# Patient Record
Sex: Female | Born: 1965 | Race: White | Hispanic: Yes | Marital: Single | Smoking: Never smoker
Health system: Southern US, Community
[De-identification: ages and names within clinical notes are randomized; demographics above are authoritative.]

## PROBLEM LIST (undated history)

## (undated) DIAGNOSIS — O24419 Gestational diabetes mellitus in pregnancy, unspecified control: Secondary | ICD-10-CM

## (undated) DIAGNOSIS — E7800 Pure hypercholesterolemia, unspecified: Secondary | ICD-10-CM

## (undated) HISTORY — DX: Gestational diabetes mellitus in pregnancy, unspecified control: O24.419

---

## 2002-04-21 ENCOUNTER — Encounter: Payer: Self-pay | Admitting: Obstetrics and Gynecology

## 2002-04-21 ENCOUNTER — Encounter (INDEPENDENT_AMBULATORY_CARE_PROVIDER_SITE_OTHER): Payer: Self-pay | Admitting: Specialist

## 2002-04-21 ENCOUNTER — Ambulatory Visit (HOSPITAL_COMMUNITY): Admission: AD | Admit: 2002-04-21 | Discharge: 2002-04-21 | Payer: Self-pay | Admitting: Obstetrics and Gynecology

## 2007-07-16 ENCOUNTER — Encounter: Payer: Self-pay | Admitting: Obstetrics & Gynecology

## 2007-07-16 ENCOUNTER — Inpatient Hospital Stay (HOSPITAL_COMMUNITY): Admission: AD | Admit: 2007-07-16 | Discharge: 2007-07-16 | Payer: Self-pay | Admitting: Obstetrics & Gynecology

## 2008-03-25 ENCOUNTER — Inpatient Hospital Stay (HOSPITAL_COMMUNITY): Admission: AD | Admit: 2008-03-25 | Discharge: 2008-03-25 | Payer: Self-pay | Admitting: Obstetrics & Gynecology

## 2008-04-12 ENCOUNTER — Ambulatory Visit (HOSPITAL_COMMUNITY): Admission: RE | Admit: 2008-04-12 | Discharge: 2008-04-12 | Payer: Self-pay | Admitting: Family Medicine

## 2008-04-19 ENCOUNTER — Ambulatory Visit (HOSPITAL_COMMUNITY): Admission: RE | Admit: 2008-04-19 | Discharge: 2008-04-19 | Payer: Self-pay | Admitting: Family Medicine

## 2008-05-10 ENCOUNTER — Ambulatory Visit (HOSPITAL_COMMUNITY): Admission: RE | Admit: 2008-05-10 | Discharge: 2008-05-10 | Payer: Self-pay | Admitting: Family Medicine

## 2008-05-18 ENCOUNTER — Ambulatory Visit (HOSPITAL_COMMUNITY): Admission: RE | Admit: 2008-05-18 | Discharge: 2008-05-18 | Payer: Self-pay | Admitting: Family Medicine

## 2008-06-01 ENCOUNTER — Inpatient Hospital Stay (HOSPITAL_COMMUNITY): Admission: AD | Admit: 2008-06-01 | Discharge: 2008-06-01 | Payer: Self-pay | Admitting: Family Medicine

## 2008-07-11 ENCOUNTER — Inpatient Hospital Stay (HOSPITAL_COMMUNITY): Admission: AD | Admit: 2008-07-11 | Discharge: 2008-07-13 | Payer: Self-pay | Admitting: Obstetrics & Gynecology

## 2008-07-11 ENCOUNTER — Ambulatory Visit: Payer: Self-pay | Admitting: Obstetrics and Gynecology

## 2008-07-18 ENCOUNTER — Encounter: Admit: 2008-07-18 | Discharge: 2008-09-04 | Payer: Self-pay | Admitting: Obstetrics & Gynecology

## 2008-07-18 ENCOUNTER — Ambulatory Visit: Payer: Self-pay | Admitting: Obstetrics & Gynecology

## 2008-08-01 ENCOUNTER — Ambulatory Visit: Payer: Self-pay | Admitting: Obstetrics & Gynecology

## 2008-08-01 ENCOUNTER — Encounter: Payer: Self-pay | Admitting: Obstetrics and Gynecology

## 2008-08-01 LAB — CONVERTED CEMR LAB
Platelets: 417 10*3/uL — ABNORMAL HIGH (ref 150–400)
RBC: 3.92 M/uL (ref 3.87–5.11)
RDW: 13.2 % (ref 11.5–15.5)
WBC: 8.3 10*3/uL (ref 4.0–10.5)

## 2008-08-08 ENCOUNTER — Ambulatory Visit: Payer: Self-pay | Admitting: Obstetrics & Gynecology

## 2008-08-09 ENCOUNTER — Ambulatory Visit (HOSPITAL_COMMUNITY): Admission: RE | Admit: 2008-08-09 | Discharge: 2008-08-09 | Payer: Self-pay | Admitting: Family Medicine

## 2008-08-11 ENCOUNTER — Ambulatory Visit: Payer: Self-pay | Admitting: Family Medicine

## 2008-08-15 ENCOUNTER — Ambulatory Visit: Payer: Self-pay | Admitting: Obstetrics & Gynecology

## 2008-08-18 ENCOUNTER — Ambulatory Visit: Payer: Self-pay | Admitting: Obstetrics & Gynecology

## 2008-08-22 ENCOUNTER — Encounter: Payer: Self-pay | Admitting: Obstetrics and Gynecology

## 2008-08-22 ENCOUNTER — Ambulatory Visit: Payer: Self-pay | Admitting: Obstetrics & Gynecology

## 2008-08-23 ENCOUNTER — Encounter: Payer: Self-pay | Admitting: Obstetrics and Gynecology

## 2008-08-25 ENCOUNTER — Ambulatory Visit: Payer: Self-pay | Admitting: Obstetrics & Gynecology

## 2008-08-29 ENCOUNTER — Ambulatory Visit: Payer: Self-pay | Admitting: Family Medicine

## 2008-09-01 ENCOUNTER — Ambulatory Visit: Payer: Self-pay | Admitting: Family Medicine

## 2008-09-05 ENCOUNTER — Ambulatory Visit: Payer: Self-pay | Admitting: Obstetrics and Gynecology

## 2008-09-05 ENCOUNTER — Inpatient Hospital Stay (HOSPITAL_COMMUNITY): Admission: AD | Admit: 2008-09-05 | Discharge: 2008-09-07 | Payer: Self-pay | Admitting: Family Medicine

## 2010-07-15 ENCOUNTER — Encounter: Payer: Self-pay | Admitting: *Deleted

## 2010-10-04 LAB — POCT URINALYSIS DIP (DEVICE)
Bilirubin Urine: NEGATIVE
Bilirubin Urine: NEGATIVE
Glucose, UA: NEGATIVE mg/dL
Nitrite: NEGATIVE
Protein, ur: 30 mg/dL — AB
Specific Gravity, Urine: 1.02 (ref 1.005–1.030)
Specific Gravity, Urine: >=1.03 (ref 1.005–1.030)
Urobilinogen, UA: 0.2 mg/dL (ref 0.0–1.0)
Urobilinogen, UA: 0.2 mg/dL (ref 0.0–1.0)
pH: 6 (ref 5.0–8.0)

## 2010-10-04 LAB — GLUCOSE, CAPILLARY
Glucose-Capillary: 79 mg/dL (ref 70–99)
Glucose-Capillary: 81 mg/dL (ref 70–99)

## 2010-10-04 LAB — CBC
HCT: 36.5 % (ref 36.0–46.0)
MCHC: 33.9 g/dL (ref 30.0–36.0)
RBC: 4.01 MIL/uL (ref 3.87–5.11)
RDW: 13.7 % (ref 11.5–15.5)
WBC: 10.5 10*3/uL (ref 4.0–10.5)

## 2010-10-04 LAB — RPR: RPR Ser Ql: NONREACTIVE

## 2010-10-04 LAB — GLUCOSE, RANDOM: Glucose, Bld: 71 mg/dL (ref 70–99)

## 2010-10-08 LAB — CBC
HCT: 35.3 % — ABNORMAL LOW (ref 36.0–46.0)
Hemoglobin: 10.2 g/dL — ABNORMAL LOW (ref 12.0–15.0)
Hemoglobin: 12.1 g/dL (ref 12.0–15.0)
MCHC: 34.1 g/dL (ref 30.0–36.0)
RBC: 3.23 MIL/uL — ABNORMAL LOW (ref 3.87–5.11)
RBC: 3.83 MIL/uL — ABNORMAL LOW (ref 3.87–5.11)
WBC: 19.6 10*3/uL — ABNORMAL HIGH (ref 4.0–10.5)
WBC: 9.5 10*3/uL (ref 4.0–10.5)

## 2010-10-08 LAB — CULTURE, BLOOD (ROUTINE X 2): Culture: NO GROWTH

## 2010-10-08 LAB — DIFFERENTIAL
Basophils Relative: 0 % (ref 0–1)
Basophils Relative: 0 % (ref 0–1)
Eosinophils Relative: 0 % (ref 0–5)
Lymphocytes Relative: 4 % — ABNORMAL LOW (ref 12–46)
Lymphocytes Relative: 7 % — ABNORMAL LOW (ref 12–46)
Lymphs Abs: 0.7 10*3/uL (ref 0.7–4.0)
Monocytes Absolute: 0.6 10*3/uL (ref 0.1–1.0)
Monocytes Relative: 6 % (ref 3–12)
Monocytes Relative: 6 % (ref 3–12)
Neutro Abs: 17.6 10*3/uL — ABNORMAL HIGH (ref 1.7–7.7)
Neutro Abs: 8.2 10*3/uL — ABNORMAL HIGH (ref 1.7–7.7)
Neutrophils Relative %: 86 % — ABNORMAL HIGH (ref 43–77)

## 2010-10-08 LAB — URINALYSIS, ROUTINE W REFLEX MICROSCOPIC
Ketones, ur: >80 mg/dL — AB
Leukocytes, UA: NEGATIVE
Protein, ur: 30 mg/dL — AB

## 2010-10-08 LAB — POCT URINALYSIS DIP (DEVICE)
Hgb urine dipstick: NEGATIVE
Ketones, ur: 15 mg/dL — AB
Protein, ur: 30 mg/dL — AB
Specific Gravity, Urine: 1.02 (ref 1.005–1.030)
Urobilinogen, UA: 0.2 mg/dL (ref 0.0–1.0)

## 2010-10-08 LAB — GLUCOSE, CAPILLARY
Glucose-Capillary: 126 mg/dL — ABNORMAL HIGH (ref 70–99)
Glucose-Capillary: 132 mg/dL — ABNORMAL HIGH (ref 70–99)
Glucose-Capillary: 136 mg/dL — ABNORMAL HIGH (ref 70–99)
Glucose-Capillary: 139 mg/dL — ABNORMAL HIGH (ref 70–99)

## 2010-10-08 LAB — URINE MICROSCOPIC-ADD ON

## 2010-10-08 LAB — STREP B DNA PROBE

## 2010-10-08 LAB — GC/CHLAMYDIA PROBE AMP, GENITAL: GC Probe Amp, Genital: NEGATIVE

## 2010-10-09 LAB — POCT URINALYSIS DIP (DEVICE)
Bilirubin Urine: NEGATIVE
Bilirubin Urine: NEGATIVE
Glucose, UA: NEGATIVE mg/dL
Hgb urine dipstick: NEGATIVE
Hgb urine dipstick: NEGATIVE
Ketones, ur: NEGATIVE mg/dL
Ketones, ur: NEGATIVE mg/dL
Protein, ur: NEGATIVE mg/dL
Protein, ur: NEGATIVE mg/dL
Specific Gravity, Urine: 1.02 (ref 1.005–1.030)
Specific Gravity, Urine: 1.025 (ref 1.005–1.030)
Urobilinogen, UA: 0.2 mg/dL (ref 0.0–1.0)
Urobilinogen, UA: 0.2 mg/dL (ref 0.0–1.0)
pH: 6.5 (ref 5.0–8.0)
pH: 7 (ref 5.0–8.0)
pH: 6.5 (ref 5.0–8.0)

## 2010-11-06 NOTE — Discharge Summary (Signed)
NAMEKIMIAH, HIBNER       ACCOUNT NO.:  [id number]   MEDICAL RECORD NO.:  [id number]          PATIENT TYPE:  INP   LOCATION:  9157                          FACILITY:  [facility]   PHYSICIAN:  Lazaro Arms, M.D.   DATE OF BIRTH:  1966/05/27   DATE OF ADMISSION:  07/11/2008  DATE OF DISCHARGE:  07/13/2008                               DISCHARGE SUMMARY   DISCHARGE DIAGNOSES:  1. Febrile illness.  2. Gestational diabetes.  3. Intrauterine pregnancy at 33 weeks' gestational age.   REASON FOR ADMISSION:  Ms. Breawna Montenegro is a 45 year old gravida 5,  para 2-0-2-2, admitted at 29-5/7 week's gestational age with abdominal  pain and fevers at home.   HOSPITAL COURSE:  The patient was admitted and had some temperatures to  101 early in her admission.  Because of some concern about her pulmonary  exam, she was started on Zithromax and the dosage of a Z-Pak.  Chest x-  ray was performed, which was negative.  Urine and blood cultures were  done, which were negative to date.  During her hospitalization because  of her gestational age and previous history of gestational diabetes,  blood sugars were checked and the patient had repeated elevated blood  sugars.  Fasting blood sugars in the 110 range and postprandial blood  sugars in the 130s to 140s range.  On hospital day #3, the patient was  approximately 36 hours afebrile and is feeling significantly better.  She has had nutrition and diabetic teaching while she was here.  She  will have her care transferred to the [facility]  where she will follow up on Monday, which will be determined once the  clinic is open for today.   FINDINGS:  Chest x-ray which showed no significant pulmonary disease.  An OB ultrasound was performed on admission, which showed an estimated  fetal weight in the 72nd percentile and estimated due date of September 16, 2008, subjectively normal amniotic fluid.  Fetal presentation that was  transverse and anterior placenta above the cervical os and cervical  length of 4.5 cm.   CONDITION AT DISCHARGE:  Good.   INSTRUCTIONS TO PATIENT:  The patient was instructed to continue her Z-  Pak and was given a prescription for the final pills.  The patient was  instructed to also continue on prenatal vitamins.  All the patient's  questions were answered and the patient was instructed to follow up at  the clinic .  The date and time  will be written on her discharge  summary.      Odie Sera, DO  Electronically Signed     ______________________________  Lazaro Arms, M.D.    MC/MEDQ  D:  07/13/2008  T:  07/13/2008  Job:  [id number]

## 2010-11-09 NOTE — Op Note (Signed)
   [name], [name]                          ACCOUNT NO.:  [id number]   MEDICAL RECORD NO.:  [id number]                   PATIENT TYPE:  AMB   LOCATION:  [facility]                                 [facility]ACILITY:  [facility]   PHYSICIAN:  Phil D. Okey Dupre, M.D.                  DATE OF BIRTH:  May 07, 1966   DATE OF PROCEDURE:  04/21/2002  DATE OF DISCHARGE:                                 OPERATIVE REPORT   PREOPERATIVE DIAGNOSIS:  Missed abortion.   POSTOPERATIVE DIAGNOSIS:  Missed abortion.   PROCEDURE:  Dilatation and evacuation.   SURGEON:  Javier Glazier. Rose, M.D.   DESCRIPTION OF PROCEDURE:  Under satisfactory sedation with the patient in  the dorsal lithotomy position, the perineum and vagina were prepped and  draped in the usual sterile manner. Bimanual pelvic examination revealed the  uterus anterior position about eight weeks' gestational size, freely movable  with normal free adnexa.  A weighted speculum was placed in the posterior  fourchette of the vagina through a marital introitus.  BUS was within normal  limits.  The vaginal was clean and well rugated, and the cervix was parous.  The anterior lip of the cervix was grasped with a single-tooth tenaculum.  The uterine cavity sounded to 10 cm.  The cervical os dilated to a #10 Haney  dilator and the 10 suction curet was used to evacuate the uterine contents  without incident.  The tenaculum and speculum were removed from the vagina  and the patient was transferred to the recovery room in satisfactory  condition.                                               Phil D. Okey Dupre, M.D.    PDR/MEDQ  D:  04/21/2002  T:  04/21/2002  Job:  [id number]

## 2011-03-14 LAB — CBC
Platelets: 463 — ABNORMAL HIGH
RBC: 4.06
WBC: 12.6 — ABNORMAL HIGH

## 2011-03-14 LAB — WET PREP, GENITAL
Clue Cells Wet Prep HPF POC: NONE SEEN
Trich, Wet Prep: NONE SEEN

## 2011-03-14 LAB — HCG, QUANTITATIVE, PREGNANCY: hCG, Beta Chain, Quant, S: 1394 — ABNORMAL HIGH

## 2011-03-14 LAB — ABO/RH: ABO/RH(D): A POS

## 2011-03-25 LAB — GC/CHLAMYDIA PROBE AMP, GENITAL
Chlamydia, DNA Probe: NEGATIVE
GC Probe Amp, Genital: NEGATIVE

## 2011-03-25 LAB — WET PREP, GENITAL: Yeast Wet Prep HPF POC: NONE SEEN

## 2011-03-28 LAB — URINALYSIS, ROUTINE W REFLEX MICROSCOPIC
Bilirubin Urine: NEGATIVE
Glucose, UA: NEGATIVE mg/dL
Hgb urine dipstick: NEGATIVE
Ketones, ur: NEGATIVE mg/dL
pH: 7 (ref 5.0–8.0)

## 2021-09-03 ENCOUNTER — Other Ambulatory Visit: Payer: Self-pay

## 2021-09-03 ENCOUNTER — Encounter (HOSPITAL_COMMUNITY): Payer: Self-pay

## 2021-09-03 ENCOUNTER — Emergency Department (HOSPITAL_COMMUNITY)
Admission: EM | Admit: 2021-09-03 | Discharge: 2021-09-03 | Disposition: A | Discharge status: Home / Self Care | Payer: Self-pay | Attending: Student | Admitting: Student

## 2021-09-03 ENCOUNTER — Encounter (HOSPITAL_COMMUNITY): Payer: Self-pay | Admitting: Emergency Medicine

## 2021-09-03 ENCOUNTER — Emergency Department (HOSPITAL_COMMUNITY): Payer: Self-pay

## 2021-09-03 ENCOUNTER — Ambulatory Visit (HOSPITAL_COMMUNITY)
Admission: RE | Admit: 2021-09-03 | Discharge: 2021-09-03 | Disposition: A | Discharge status: Home / Self Care | Payer: Self-pay | Source: Ambulatory Visit

## 2021-09-03 VITALS — BP 133/87 | HR 60 | Temp 97.8°F | Resp 17

## 2021-09-03 DIAGNOSIS — S0512XA Contusion of eyeball and orbital tissues, left eye, initial encounter: Secondary | ICD-10-CM | POA: Insufficient documentation

## 2021-09-03 DIAGNOSIS — S0511XA Contusion of eyeball and orbital tissues, right eye, initial encounter: Secondary | ICD-10-CM | POA: Insufficient documentation

## 2021-09-03 DIAGNOSIS — W19XXXA Unspecified fall, initial encounter: Secondary | ICD-10-CM

## 2021-09-03 DIAGNOSIS — Z23 Encounter for immunization: Secondary | ICD-10-CM | POA: Insufficient documentation

## 2021-09-03 DIAGNOSIS — W01198A Fall on same level from slipping, tripping and stumbling with subsequent striking against other object, initial encounter: Secondary | ICD-10-CM | POA: Insufficient documentation

## 2021-09-03 DIAGNOSIS — S0993XA Unspecified injury of face, initial encounter: Secondary | ICD-10-CM

## 2021-09-03 DIAGNOSIS — S022XXA Fracture of nasal bones, initial encounter for closed fracture: Secondary | ICD-10-CM | POA: Insufficient documentation

## 2021-09-03 DIAGNOSIS — Y92007 Garden or yard of unspecified non-institutional (private) residence as the place of occurrence of the external cause: Secondary | ICD-10-CM | POA: Insufficient documentation

## 2021-09-03 DIAGNOSIS — S0990XA Unspecified injury of head, initial encounter: Secondary | ICD-10-CM | POA: Insufficient documentation

## 2021-09-03 HISTORY — DX: Pure hypercholesterolemia, unspecified: E78.00

## 2021-09-03 MED ORDER — TETANUS-DIPHTH-ACELL PERTUSSIS 5-2.5-18.5 LF-MCG/0.5 IM SUSY
0.5000 mL | PREFILLED_SYRINGE | Freq: Once | INTRAMUSCULAR | Status: AC
  Administered 2021-09-03: 0.5 mL via INTRAMUSCULAR
  Filled 2021-09-03: qty 0.5

## 2021-09-03 MED ORDER — OXYMETAZOLINE HCL 0.05 % NA SOLN
1.0000 | Freq: Once | NASAL | Status: AC
  Administered 2021-09-03: 1 via NASAL
  Filled 2021-09-03: qty 30

## 2021-09-03 NOTE — ED Provider Notes (Cosign Needed)
[facility] EMERGENCY DEPARTMENT Provider Note  CSN: [id number] Arrival date & time: 09/03/21 1600  Chief Complaint(s) Fall  HPI Traci Snyder is a 56 y.o. female presenting from urgent care for bilateral eye swelling/bruising with associated nasal swelling.  Patient also has a small laceration to the bridge of her nose.  Patient reports this all happened when she was moving equipment in a wheelbarrow like cart when she fell forward and hit her head on it.  She reports that she could not see well and just saw blackness for approximately 5 minutes and then had clear vision.  Patient does not like she lost consciousness.  She has not had a persistent headache, dizziness, numbness, or tingling.  Patient has not had any neck pain.  Patient reports has been hard to breathe out of her nose and reports up until this point had difficulty with allergies and congestion.  Patient reports initial nosebleed that then stopped and has not recurred.  Patient denies any fevers, chills, nausea, vomiting.  Patient still has not had any vision changes or any headaches.   Fall   Past Medical History Past Medical History:  Diagnosis Date   High cholesterol    There are no problems to display for this patient.  Home Medication(s) Prior to Admission medications   Medication Sig Start Date End Date Taking? Authorizing Provider  vitamin k 100 MCG tablet Take 100 mcg by mouth daily.    [provider]                                                                                                                                    Past Surgical History No past surgical history on file. Family History No family history on file.  Social History   Allergies Patient has no known allergies.  Review of Systems Review of Systems  HENT:  Positive for congestion, facial swelling, nosebleeds and rhinorrhea.   Skin:  Positive for wound.   Physical Exam Vital Signs  I have  reviewed the triage vital signs BP (!) 117/94 (BP Location: Left Arm)    Pulse 63    Temp 98.2 F (36.8 C) (Oral)    Resp 17    SpO2 100%   Physical Exam HENT:     Head: Normocephalic.     Comments: Edema to bridge of nose, ecchymosis below bilateral eyes, tenderness to palpation midface and nasal bridge  Small overlying abrasion which is hemostatic over nasal bridge.  No nasal septal hematoma. Cardiovascular:     Rate and Rhythm: Normal rate and regular rhythm.  Pulmonary:     Effort: Pulmonary effort is normal. No respiratory distress.     Breath sounds: No stridor.    ED Results and Treatments Labs (all labs ordered are listed, but only abnormal results are displayed) Labs Reviewed - No data to display  Radiology CT Head Wo Contrast  Result Date: 09/03/2021 CLINICAL DATA:  Head trauma, moderate to severe. Trip and fall injury on Saturday. No loss of consciousness. EXAM: CT HEAD WITHOUT CONTRAST TECHNIQUE: Contiguous axial images were obtained from the base of the skull through the vertex without intravenous contrast. RADIATION DOSE REDUCTION: This exam was performed according to the departmental dose-optimization program which includes automated exposure control, adjustment of the mA and/or kV according to patient size and/or use of iterative reconstruction technique. COMPARISON:  None. FINDINGS: Brain: No evidence of acute infarction, hemorrhage, hydrocephalus, extra-axial collection or mass lesion/mass effect. Vascular: No hyperdense vessel or unexpected calcification. Skull: Calvarium appears intact. No acute depressed skull fractures. Sinuses/Orbits: See additional report of maxillofacial CT. Other: None. IMPRESSION: No acute intracranial abnormalities. Electronically Signed   By: Burman Nieves M.D.   On: 09/03/2021 20:52   CT Cervical Spine Wo Contrast  Result  Date: 09/03/2021 CLINICAL DATA:  Trip and fall injury. EXAM: CT CERVICAL SPINE WITHOUT CONTRAST TECHNIQUE: Multidetector CT imaging of the cervical spine was performed without intravenous contrast. Multiplanar CT image reconstructions were also generated. RADIATION DOSE REDUCTION: This exam was performed according to the departmental dose-optimization program which includes automated exposure control, adjustment of the mA and/or kV according to patient size and/or use of iterative reconstruction technique. COMPARISON:  None. FINDINGS: Alignment: Reversal of the usual cervical lordosis without anterior subluxation, likely positional but could indicate muscle spasm. Normal alignment of the posterior elements. C1-2 articulation appears intact. Skull base and vertebrae: Skull base appears intact. Old appearing ununited ossicles are demonstrated at the C1-2 articulations, likely degenerative. Ligamentous avulsion would be a less likely consideration as these appear to be well corticated. No vertebral compression deformities. No focal bone lesion or bone destruction. Cortex appears intact. Soft tissues and spinal canal: No prevertebral soft tissue swelling. No abnormal paraspinal soft tissue mass or infiltration. Disc levels: Degenerative changes with disc space narrowing and endplate osteophyte formation at C5-6 and C6-7 levels. Degenerative changes in the mid cervical facet joints. Upper chest: Visualized lung apices are clear. Other: None. IMPRESSION: Nonspecific reversal of the usual cervical lordosis. Mild degenerative changes at C1-2, C5-6, and C6-7 levels. No acute displaced fractures identified. Electronically Signed   By: Burman Nieves M.D.   On: 09/03/2021 21:00   CT Maxillofacial Wo Contrast  Result Date: 09/03/2021 CLINICAL DATA:  Blunt facial trauma after trip and fall injury on Saturday. EXAM: CT MAXILLOFACIAL WITHOUT CONTRAST TECHNIQUE: Multidetector CT imaging of the maxillofacial structures was  performed. Multiplanar CT image reconstructions were also generated. RADIATION DOSE REDUCTION: This exam was performed according to the departmental dose-optimization program which includes automated exposure control, adjustment of the mA and/or kV according to patient size and/or use of iterative reconstruction technique. COMPARISON:  None. FINDINGS: Osseous: Acute comminuted and depressed fractures of the anterior nasal bones with involvement of the anterior nasal septum. Orbital rims, maxillary antral walls, zygomatic arches, pterygoid plates, mandibles, and temporomandibular joints appear intact. Orbits: The globes and extraocular muscles appear intact and symmetrical. Sinuses: Paranasal sinuses and mastoid air cells are clear. Soft tissues: Soft tissue swelling over the bridge of the nose. No loculated collections identified. Limited intracranial: No significant or unexpected finding. IMPRESSION: Acute comminuted and depressed fractures of the anterior nasal bones with involvement of the anterior nasal septum. Associated soft tissue swelling. Electronically Signed   By: Burman Nieves M.D.   On: 09/03/2021 20:56    Pertinent labs & imaging results that were available during my  care of the patient were reviewed by me and considered in my medical decision making (see MDM for details).  Medications Ordered in ED Medications  Tdap (BOOSTRIX) injection 0.5 mL (0.5 mLs Intramuscular Given 09/03/21 1807)  oxymetazoline (AFRIN) 0.05 % nasal spray 1 spray (1 spray Each Nare Given 09/03/21 2148)                                                                                                                                     Procedures Procedures  (including critical care time)  Medical Decision Making / ED Course   This patient presents to the ED for concern of nasal fracture/ injury, this involves an extensive number of treatment options, and is a complaint that carries with it a high risk of  complications and morbidity.  The differential diagnosis includes nasal fracture, nasal septal hematoma, orbital floor fracture, intracranial bleed, or other acute injury.  Therefore imaging of her head neck and face obtained.  Imaging revealed acute comminuted and depressed fractures of the anterior nasal bones with involvement of the anterior septum with associated soft tissue swelling.  Patient was given Afrin and her tetanus is updated.  Her abrasion does not need any repair at this time.  Case discussed with ENT who recommends outpatient follow-up in their clinic.  Information given at the time of discharge.  Strict return precautions given.  Shortness interpreter used for the entire encounter.   Additional history obtained: -Additional history obtained from Husband -External records from outside source obtained and reviewed including: Chart review including previous notes, labs, imaging, consultation notes.  Reviewed urgent care note.   Lab Tests: -No laboratory testing indicated at this time. -No EKG indicated at this time  Imaging Studies ordered: I ordered imaging studies including CT head, Face, neck I independently visualized and interpreted imaging. I agree with the radiologist interpretation   Medicines ordered and prescription drug management: Meds ordered this encounter  Medications   Tdap (BOOSTRIX) injection 0.5 mL   oxymetazoline (AFRIN) 0.05 % nasal spray 1 spray    -I have reviewed the patients home medicines and have made adjustments as needed  Consultations Obtained: Spoke with ENT regarding imaging reads who recommends outpatient follow-up for acute comminuted and displaced nasal fractures.  Social Determinants of Health:  Factors impacting patients care include: Include is not the primary language.  [employer] interpreter used for history, physical, and discharge instructions.   Reevaluation: After the interventions noted above, I reevaluated the patient and  found that they have :improved  Co morbidities that complicate the patient evaluation  Past Medical History:  Diagnosis Date   High cholesterol       Dispostion: I considered admission for this patient, however is not warranted at this time given ability to manage the fractures on outpatient basis and pain being controlled.  Patient is in no respiratory distress and able to breathe even though her nasal passages  are extremely edematous.  It is safe for discharge home at this time.   Final Clinical Impression(s) / ED Diagnoses Final diagnoses:  Closed fracture of nasal bone, initial encounter  Fall, initial encounter  Injury of head, initial encounter      Micheline Maze, MD 09/03/21 2348

## 2021-09-03 NOTE — ED Notes (Signed)
Pt verbalized understanding of d/c instructions and follow up care. D/c instructions reviewed using spanish ipad interpreter.  ?

## 2021-09-03 NOTE — ED Provider Triage Note (Signed)
Emergency Medicine Provider Triage Evaluation Note ? ?Jamisen Jorge Ny , a 56 y.o. female  was evaluated in triage.  Pt complains of fall. Pt tripped while working and fell forward hitting her face on a wheelchair. She has swelling and pain to her face. ? ?Review of Systems  ?Positive: Head injury, facial injury ?Negative: Neck pain ? ?Physical Exam  ?BP (!) 143/86 (BP Location: Left Arm)   Pulse (!) 58   Temp 98.2 ?F (36.8 ?C) (Oral)   Resp 16   SpO2 96%  ?Gen:   Awake, no distress   ?Resp:  Normal effort  ?MSK:   Moves extremities without difficulty  ?Other:  TTP to the nasal bone and inferior orbital ribs bilat, clear speech, no facial droop, moving all extremities ? ?Medical Decision Making  ?Medically screening exam initiated at 5:37 PM.  Appropriate orders placed.  Kellsey L Letecia Arps was informed that the remainder of the evaluation will be completed by another provider, this initial triage assessment does not replace that evaluation, and the importance of remaining in the ED until their evaluation is complete. ? ? ?  ?Karrie Meres, PA-C ?09/03/21 1737 ? ?

## 2021-09-03 NOTE — ED Notes (Signed)
This RN has called CT to determine the delay, was told she has one patient ahead of her. Pt informed. ?

## 2021-09-03 NOTE — ED Notes (Signed)
Patient transported to CT 

## 2021-09-03 NOTE — ED Triage Notes (Signed)
Pt was outside doing yard work when tripped & fell hitting her face on wheelbarrow. Pt has bilat eye swelling and laceration to bridge of nose.  ?Pt reports for 3-5 minutes she saw black and felt off balance but denies LOC.  ?

## 2021-09-03 NOTE — Discharge Instructions (Addendum)
You were evaluated in the Emergency Department and after careful evaluation, we did not find any emergent condition requiring admission or further testing in the hospital. ? ?Your exam/testing today showed nasal fractures.  Do not blow your nose.  You can use Afrin every 6 hours as needed.  You can use ice to help with inflammation.  Please take Tylenol and Motrin for pain control. ? ?Please return to the Emergency Department if you experience any worsening of your condition.  Thank you for allowing [place] to be a part of your care.  ?

## 2021-09-03 NOTE — ED Provider Notes (Signed)
? ?[facility] ?Provider Note ? ?Patient Contact: 3:44 PM (approximate) ? ? ?History  ? ?Fall, Facial Injury, and appt 3 ? ? ?HPI ? ?Traci Snyder is a 56 y.o. female presents to the urgent care with bilateral, periorbital ecchymosis and edema as well as a superficial laceration along the superior aspect of the nose after patient fell and hit her head against a wheel barrel on Saturday.  Patient states that she saw blackness for approximately 5 minutes and then had clear vision.  She denies persistent dizziness or generalized headache but is concerned as her facial swelling seems to be worsening.  She states it has been difficult for her to breathe through her nose.  She denies neck pain.  No numbness or tingling in the upper and lower extremities. ? ?  ? ? ?Physical Exam  ? ?Triage Vital Signs: ?ED Triage Vitals  ?Enc Vitals Group  ?   BP 09/03/21 1523 133/87  ?   Pulse Rate 09/03/21 1523 60  ?   Resp 09/03/21 1523 17  ?   Temp 09/03/21 1523 97.8 ?F (36.6 ?C)  ?   Temp Source 09/03/21 1523 Oral  ?   SpO2 09/03/21 1523 96 %  ?   Weight --   ?   Height --   ?   Head Circumference --   ?   Peak Flow --   ?   Pain Score 09/03/21 1517 4  ?   Pain Loc --   ?   Pain Edu? --   ?   Excl. in GC? --   ? ? ?Most recent vital signs: ?Vitals:  ? 09/03/21 1523  ?BP: 133/87  ?Pulse: 60  ?Resp: 17  ?Temp: 97.8 ?F (36.6 ?C)  ?SpO2: 96%  ? ? ? ?General: Alert and in no acute distress. ?Eyes:  PERRL. EOMI. patient has bilateral periorbital ecchymosis and swelling along the left aspect of the nasal bridge. ?Head: No acute traumatic findings ?ENT: ?     Nose: No congestion/rhinnorhea. ?     Mouth/Throat: Mucous membranes are moist. ?Neck: No stridor. No cervical spine tenderness to palpation.  ?Cardiovascular:  Good peripheral perfusion ?Respiratory: Normal respiratory effort without tachypnea or retractions. Lungs CTAB. Good air entry to the bases with no decreased or absent breath  sounds. ?Gastrointestinal: Bowel sounds ?4 quadrants. Soft and nontender to palpation. No guarding or rigidity. No palpable masses. No distention. No CVA tenderness. ?Musculoskeletal: Full range of motion to all extremities.  ?Neurologic:  No gross focal neurologic deficits are appreciated.  ?Skin:   No rash noted ?Other: ? ? ?ED Results / Procedures / Treatments  ? ?Labs ?(all labs ordered are listed, but only abnormal results are displayed) ?Labs Reviewed - No data to display ? ? ? ? ? ? ? ?PROCEDURES: ? ?Critical Care performed: No ? ?Procedures ? ? ?MEDICATIONS ORDERED IN ED: ?Medications - No data to display ? ? ?IMPRESSION / MDM / ASSESSMENT AND PLAN / ED COURSE  ?I reviewed the triage vital signs and the nursing notes. ?             ?               ? ?Assessment and plan ?Facial Trauma:  ? ?Differential diagnosis includes, but is not limited to, facial fracture, skull fracture, intracranial bleed... ? ?56 year old female presents to the emergency department with bilateral facial ecchymosis and swelling of the nose after falling against a wheel barrel on  Saturday. ? ?Vital signs are reassuring at triage.  On physical exam, patient was alert, active and nontoxic-appearing with no neurodeficits noted.  Explained to patient that we do not have access to head or face CT in the urgent care setting and patient was referred to the emergency department for further care and management. ? ? ?FINAL CLINICAL IMPRESSION(S) / ED DIAGNOSES  ? ?Final diagnoses:  ?Facial injury, initial encounter  ? ? ? ?Rx / DC Orders  ? ?ED Discharge Orders   ? ? None  ? ?  ? ? ? ?Note:  This document was prepared using Dragon voice recognition software and may include unintentional dictation errors. ?  ?Orvil Feil, PA-C ?09/03/21 1550 ? ?

## 2021-09-03 NOTE — Discharge Instructions (Addendum)
Please go to emergency department for facial CT/head CT. ?

## 2021-09-03 NOTE — ED Triage Notes (Signed)
Patient sent to ED for Ct of face, patient tripped fell and hit her face on a wheelbarrow Saturday while doing yard work. Denies LOC, patient is alert, oriented, ambulatory, and in no apparent distress at this time. ?

## 2021-09-13 ENCOUNTER — Telehealth (INDEPENDENT_AMBULATORY_CARE_PROVIDER_SITE_OTHER): Payer: Self-pay | Admitting: Otolaryngology

## 2021-09-13 NOTE — Telephone Encounter (Signed)
I talked to her on the phone and she is doing well and does not need follow. Her nose is perfect she says ?

## 2021-12-05 ENCOUNTER — Encounter: Payer: Self-pay | Admitting: Radiology

## 2021-12-12 ENCOUNTER — Ambulatory Visit: Payer: Self-pay | Admitting: Radiology

## 2021-12-12 ENCOUNTER — Telehealth: Payer: Self-pay

## 2021-12-12 ENCOUNTER — Encounter: Payer: Self-pay | Admitting: Radiology

## 2021-12-12 ENCOUNTER — Other Ambulatory Visit (HOSPITAL_COMMUNITY)
Admission: RE | Admit: 2021-12-12 | Discharge: 2021-12-12 | Disposition: A | Discharge status: Home / Self Care | Payer: Self-pay | Source: Ambulatory Visit | Attending: Radiology | Admitting: Radiology

## 2021-12-12 VITALS — BP 128/92 | Ht 60.5 in | Wt 207.0 lb

## 2021-12-12 DIAGNOSIS — Z1211 Encounter for screening for malignant neoplasm of colon: Secondary | ICD-10-CM

## 2021-12-12 DIAGNOSIS — Z1231 Encounter for screening mammogram for malignant neoplasm of breast: Secondary | ICD-10-CM

## 2021-12-12 DIAGNOSIS — Z01419 Encounter for gynecological examination (general) (routine) without abnormal findings: Secondary | ICD-10-CM

## 2021-12-12 DIAGNOSIS — N763 Subacute and chronic vulvitis: Secondary | ICD-10-CM

## 2021-12-12 DIAGNOSIS — R030 Elevated blood-pressure reading, without diagnosis of hypertension: Secondary | ICD-10-CM

## 2021-12-12 DIAGNOSIS — A630 Anogenital (venereal) warts: Secondary | ICD-10-CM

## 2021-12-12 MED ORDER — NYSTATIN-TRIAMCINOLONE 100000-0.1 UNIT/GM-% EX OINT: 1.0000 | TOPICAL_OINTMENT | Freq: Two times a day (BID) | CUTANEOUS | 1 refills | Status: AC

## 2021-12-12 NOTE — Telephone Encounter (Signed)
-----   Message from Tanda Rockers, NP sent at 12/12/2021 10:25 AM EDT ----- Regarding: Referrals Please refer to GI for elevated BP. GI for screening colonoscopy. Spanish speaking.

## 2021-12-12 NOTE — Telephone Encounter (Signed)
Referrals have been sent.   If you dont mind notifying pt at your earliest convenience that we have placed referrals to:  [facility]  [address] [place], [place] [address] [phone]  Brooke Dare [address] [place], [place] [address] [phone]  If she doesn't hear from them within the next week to make her initial appt then she can call them to make an appt. Thanks.

## 2021-12-12 NOTE — Telephone Encounter (Signed)
Per Elane Fritz: "Patient was provided with information for Primary Care and Gastroenterologist. She will call within a week to get scheduled if she doesn't hear from them."

## 2021-12-12 NOTE — Progress Notes (Signed)
Traci Snyder [birth year] [id number]   History: Postmenopausal 55 y.o. presents for annual exam. Accompanied by her husbadn and Spanish interpreter. She has not had any medical care in the 13years before 2023 when she had a fall and was seen in the ER. She reports severe vulvar itching x 10 years. Notices tissue under her nails when she scratches. Sex is very painful. She has changed her soap multiple times with no improvement.    Gynecologic History Postmenopausal Last Pap: 2005. Results were: normal Last mammogram: never Last colonoscopy: never HRT use: no  Obstetric History OB History  Gravida Para Term Preterm AB Living  5 3     2 3   SAB IAB Ectopic Multiple Live Births          3    # Outcome Date GA Lbr Len/2nd Weight Sex Delivery Anes PTL Lv  5 AB           4 AB           3 Para           2 Para           1 Para              The following portions of the patient's history were reviewed and updated as appropriate: allergies, current medications, past family history, past medical history, past social history, past surgical history, and problem list.  Review of Systems Pertinent items noted in HPI and remainder of comprehensive ROS otherwise negative.  Past medical history, past surgical history, family history and social history were all reviewed and documented in the EPIC chart.  Exam:  Vitals:   12/12/21 0934  BP: (!) 128/92  Weight: 207 lb (93.9 kg)  Height: 5' 0.5" (1.537 m)   Body mass index is 39.76 kg/m.  General appearance:  Normal Thyroid:  Symmetrical, normal in size, without palpable masses or nodularity. Respiratory  Auscultation:  Clear without wheezing or rhonchi Cardiovascular  Auscultation:  Regular rate, without rubs, murmurs or gallops  Edema/varicosities:  Not grossly evident Abdominal  Soft,nontender, without masses, guarding or rebound.  Liver/spleen:  No organomegaly noted  Hernia:  None appreciated  Skin  Inspection:   Grossly normal Breasts: Examined lying and sitting.   Right: Without masses, retractions, nipple discharge or axillary adenopathy.   Left: Without masses, retractions, nipple discharge or axillary adenopathy. Genitourinary   Inguinal/mons:  Normal without inguinal adenopathy  External genitalia:  multiple 5-6mm clusters of condyloma. Severe erythema and sloughing of labia minora  BUS/Urethra/Skene's glands:  Normal  Vagina:  Normal appearing with normal color and discharge, no lesions. Atrophy: mild   Cervix:  Normal appearing without discharge or lesions  Uterus:  Normal in size, shape and contour.  Midline and mobile, nontender  Adnexa/parametria:     Rt: Normal in size, without masses or tenderness.   Lt: Normal in size, without masses or tenderness.  Anus and perineum: Normal    Patient informed chaperone available to be present for breast and pelvic exam. Patient has requested no chaperone to be present. Patient has been advised what will be completed during breast and pelvic exam.   Assessment/Plan:   1. Well woman exam with routine gynecological exam  - Cytology - PAP( [facility])  2. Subacute vulvitis -pelvic rest -mild soap to wash such as dove - nystatin-triamcinolone ointment (MYCOLOG); Apply 1 Application topically 2 (two) times daily.  Dispense: 30 g; Refill: 1  3. Condyloma acuminatum of  vulva Will treat once vulvitis is resolved  4. Elevated BP without diagnosis of hypertension PCP referral for management and labs  5. Screening for colon cancer GI referral  6. Screening mammogram for breast cancer Number given for the breast center to schedule mammogram     Discussed SBE, colonoscopy and DEXA screening as directed. Recommend of exercise weekly, including weight bearing exercise. Encouraged the use of seatbelts and sunscreen.  Return in 1 year for annual and 2 months for follow up  Tanda Rockers WHNP-BC, 10:19 AM 12/12/2021

## 2021-12-17 LAB — CYTOLOGY - PAP
Adequacy: ABSENT
Comment: NEGATIVE
Diagnosis: NEGATIVE
High risk HPV: NEGATIVE

## 2022-01-09 NOTE — Telephone Encounter (Signed)
Would you mind calling pt and ask her if she has had the chance to call the PCP and/or the gastroenterologist office to get an appt yet? Please and thank you.

## 2022-01-11 NOTE — Telephone Encounter (Signed)
Per Elane Fritz: "Called patient she has not been able to schedule any appointment yet, patient said she has been dealing with a family issue but will schedule appointment as soon as she is able to."

## 2022-02-05 NOTE — Telephone Encounter (Signed)
Pt scheduled w/ [facility] on 03/04/22.   Would you mind following up with her to see about the PCP referral as well. Just inquiring what her plans are regarding it? We could always close it for now and resend the referral at a later date if that would work better for her instead of [place] bothering her about it every so often. Let us know, thanks.

## 2022-02-05 NOTE — Telephone Encounter (Signed)
FYI.  Per Elane Fritz: "I spoke to patient today regarding an appointment she had scheduled with [place] which she cancelled due to not having insurance. She already has an appointment scheduled for a colonoscopy on 04/01/2022. The PCP appointment she said will have to wait due to not having insurance. She said she will give [place] a call when she is ready to schedule with a PCP."   Ok to close?

## 2022-02-06 ENCOUNTER — Ambulatory Visit: Payer: Self-pay | Admitting: Radiology

## 2022-02-06 NOTE — Telephone Encounter (Signed)
Ok to close, thanks.

## 2022-04-01 ENCOUNTER — Encounter: Payer: Self-pay | Admitting: Internal Medicine

## 2022-04-15 ENCOUNTER — Encounter: Payer: Self-pay | Admitting: Internal Medicine

## 2022-10-11 IMAGING — CT CT CERVICAL SPINE W/O CM
3 of 4 series · 10 of 33 positions shown, 12 images · non-contrast
Comparison: None.

CLINICAL DATA: Trip and fall injury.



[Series 8: sag bone · sagittal · 0.28mm/px · 5 of 102 slices shown, 6 images]
[im 34/102  bone]
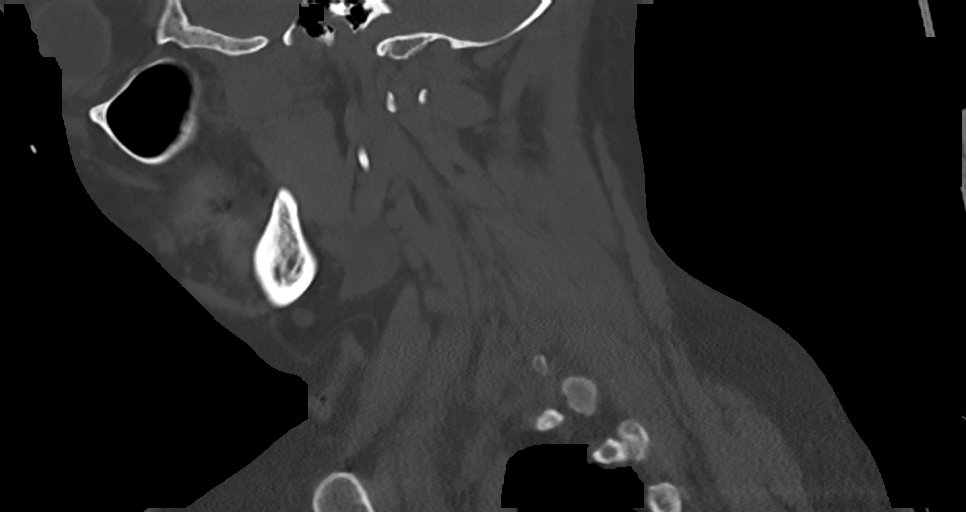
[im 43/102  bone]
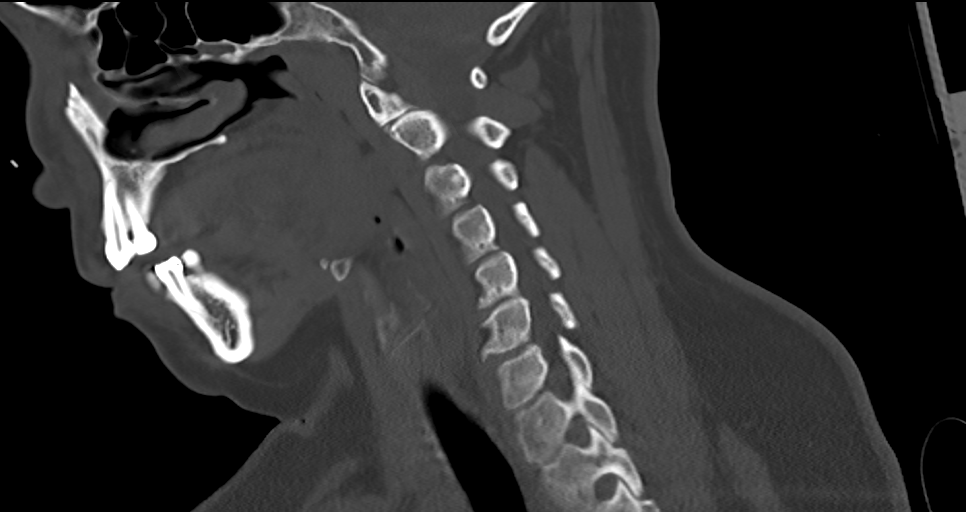
[im 51/102  soft-tissue]
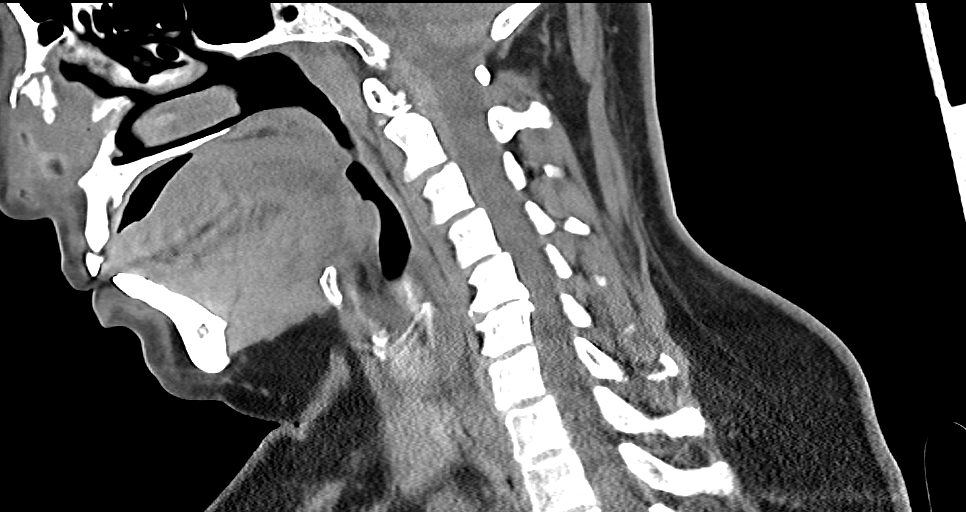
[im 51/102  bone]
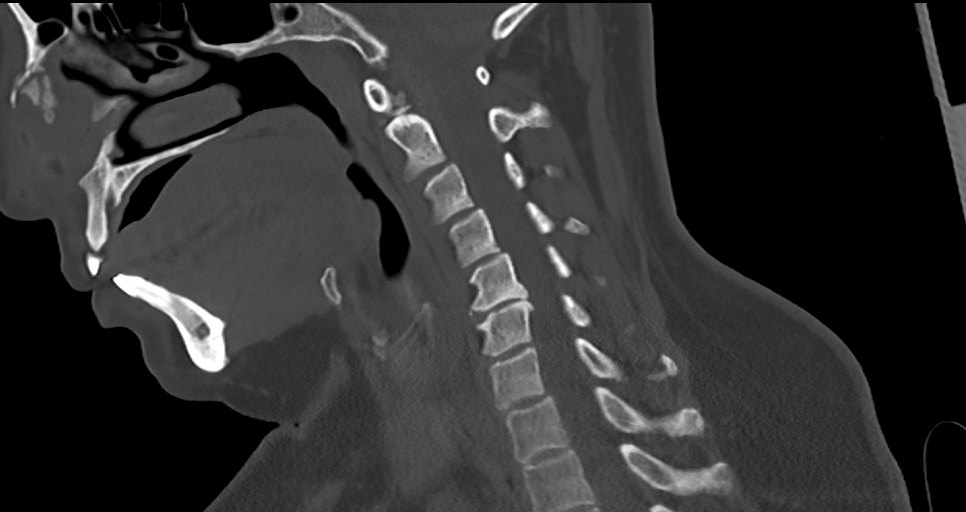
[im 59/102  bone]
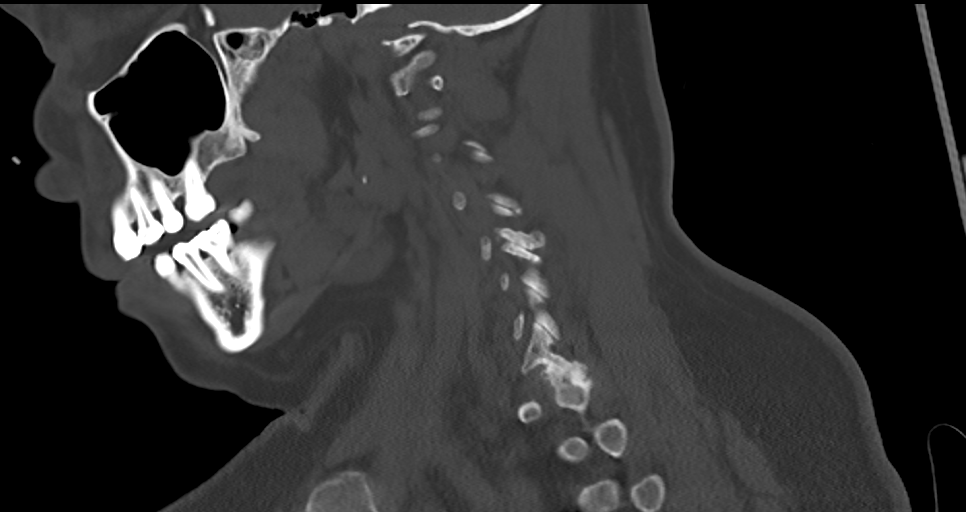
[im 68/102  bone]
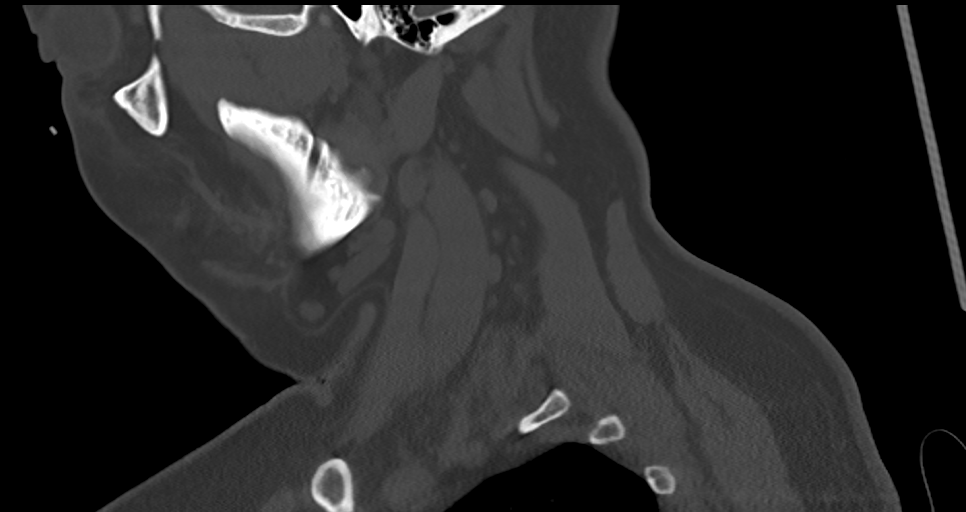

[Series 9: cor bone · coronal · 0.27mm/px · 3 of 123 slices shown]
[im 25/123  bone]
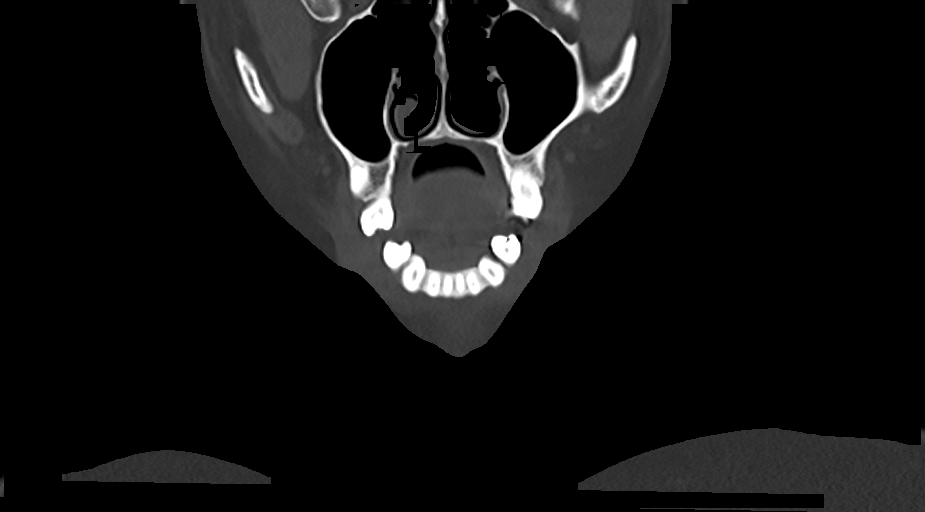
[im 49/123  bone]
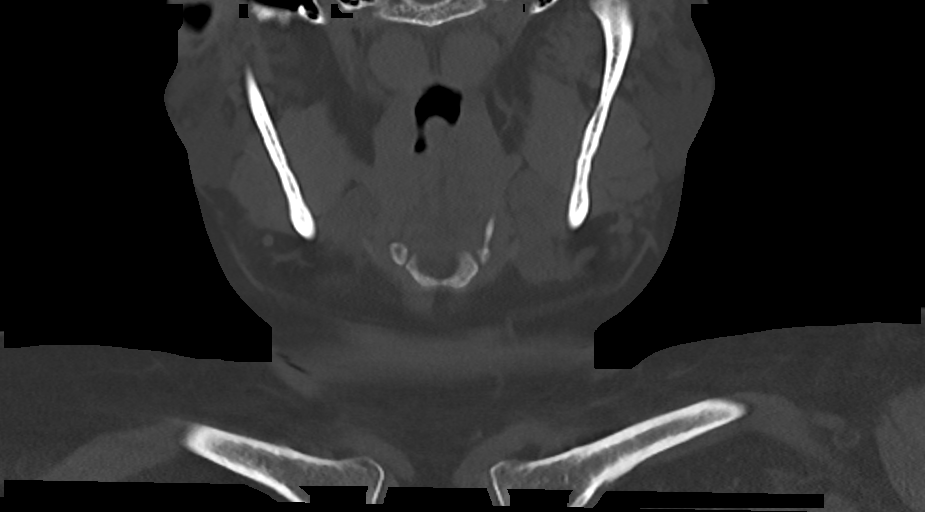
[im 74/123  bone]
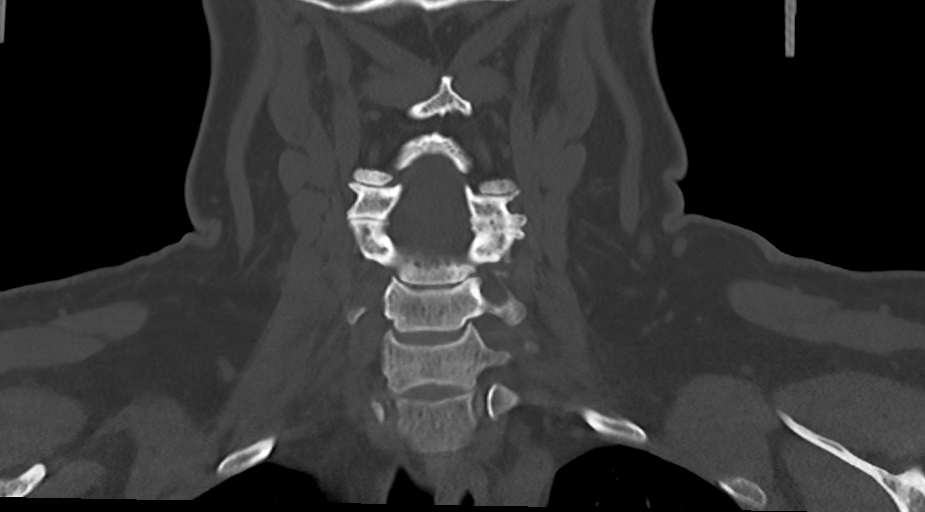

[Series 10: orthogonal axials · axial · 0.21mm/px · z∈[-207,-165]mm · 2 of 80 slices shown, 3 images]
[im 27/80  soft-tissue]
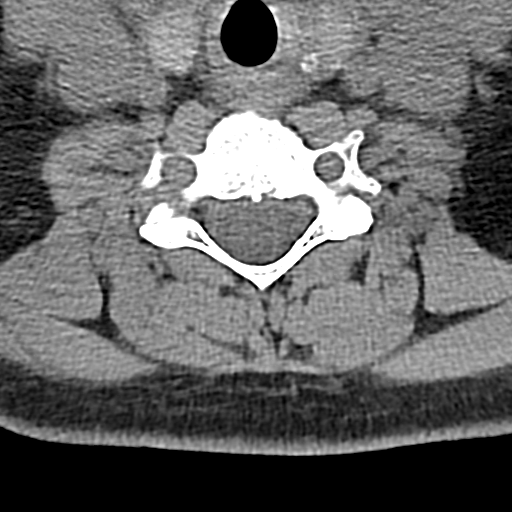
[im 27/80  bone]
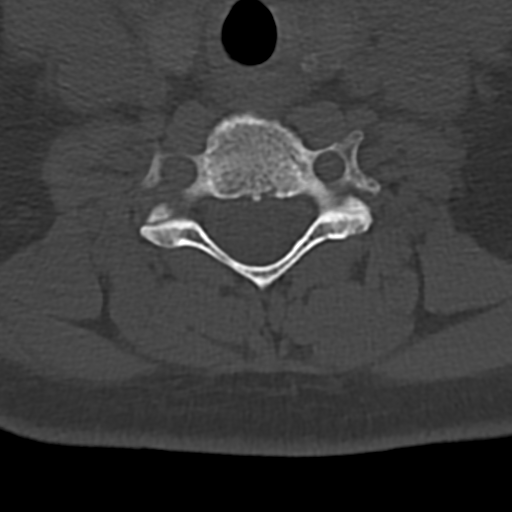
[im 53/80  bone]
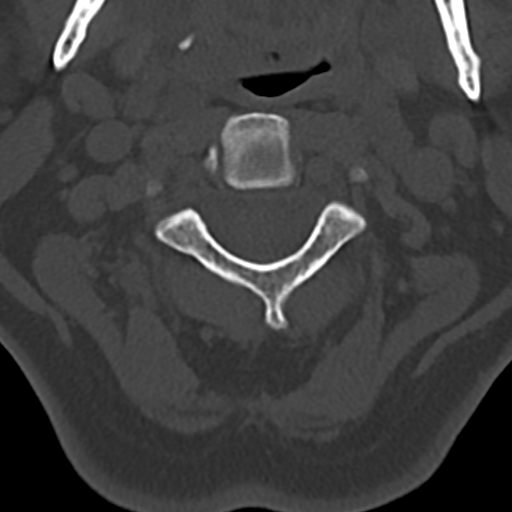

[10 of 33 positions shown; findings below may reference images not displayed]

FINDINGS: Alignment: Reversal of the usual cervical lordosis without anterior
subluxation, likely positional but could indicate muscle spasm.
Normal alignment of the posterior elements. C1-2 articulation
appears intact.

Skull base and vertebrae: Skull base appears intact. Old appearing
ununited ossicles are demonstrated at the C1-2 articulations, likely
degenerative. Ligamentous avulsion would be a less likely
consideration as these appear to be well corticated. No vertebral
compression deformities. No focal bone lesion or bone destruction.
Cortex appears intact.

Soft tissues and spinal canal: No prevertebral soft tissue swelling.
No abnormal paraspinal soft tissue mass or infiltration.

Disc levels: Degenerative changes with disc space narrowing and
endplate osteophyte formation at C5-6 and C6-7 levels. Degenerative
changes in the mid cervical facet joints.

Upper chest: Visualized lung apices are clear.

Other: None.
IMPRESSION: Nonspecific reversal of the usual cervical lordosis. Mild
degenerative changes at C1-2, C5-6, and C6-7 levels. No acute
displaced fractures identified.

## 2022-10-11 IMAGING — CT CT MAXILLOFACIAL W/O CM
3 of 6 series · 13 of 47 positions shown, 15 images · non-contrast
Comparison: None.

CLINICAL DATA: Blunt facial trauma after trip and fall injury on
[REDACTED].



[Series 3: maxilllofacial 2.0 hr40 3 · axial · 0.37mm/px · z∈[-210,-80]mm · 8 of 77 slices shown, 10 images]
[im 6/77  brain]
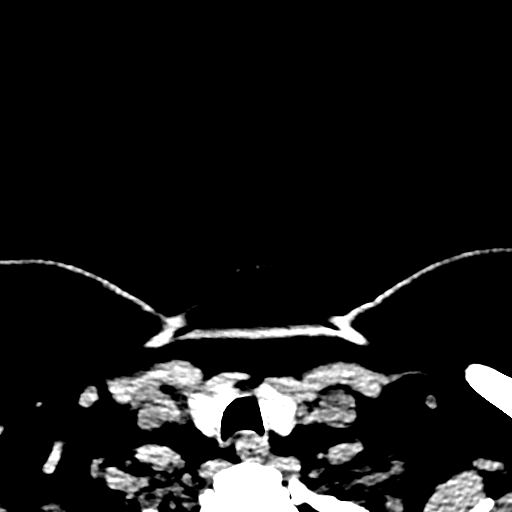
[im 6/77  bone]
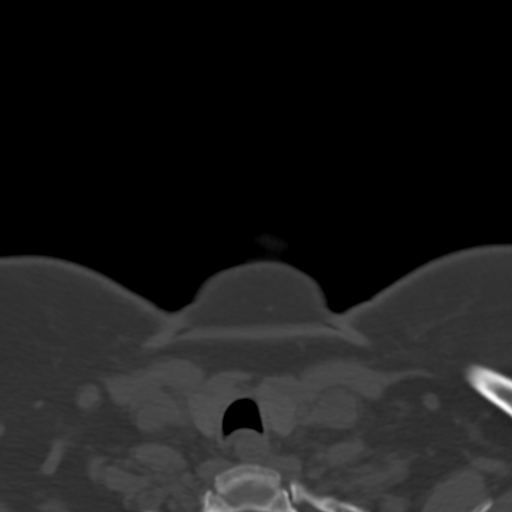
[im 17/77  bone]
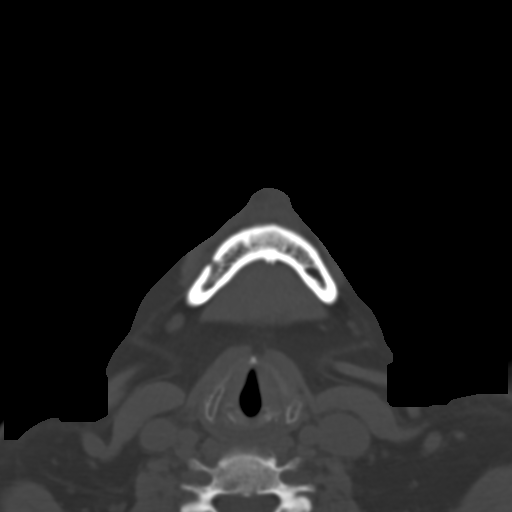
[im 28/77  bone]
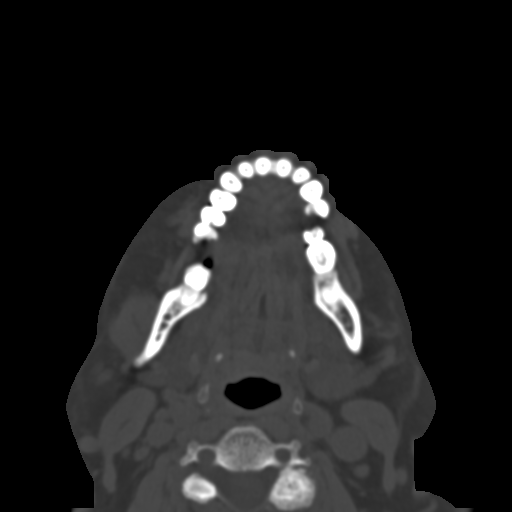
[im 33/77  bone]
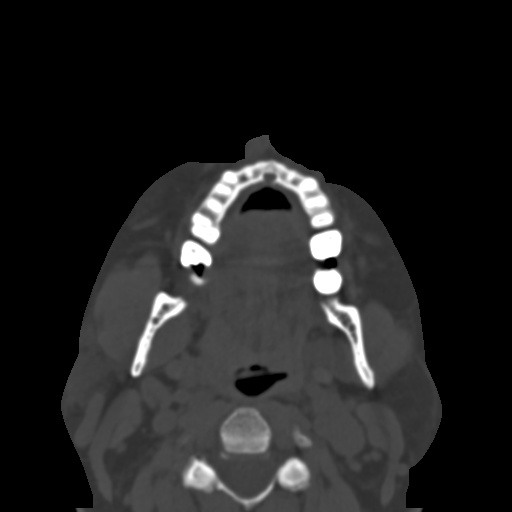
[im 44/77  brain]
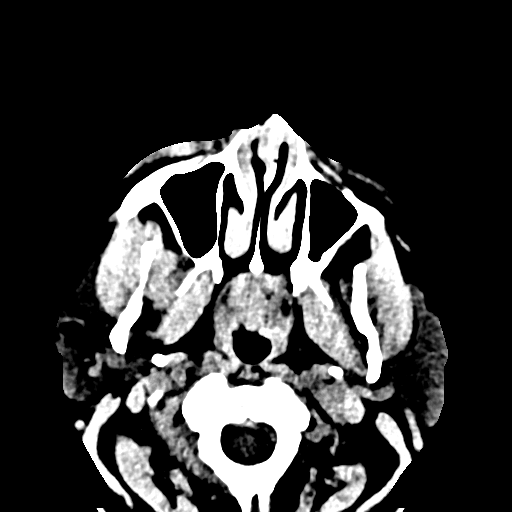
[im 44/77  bone]
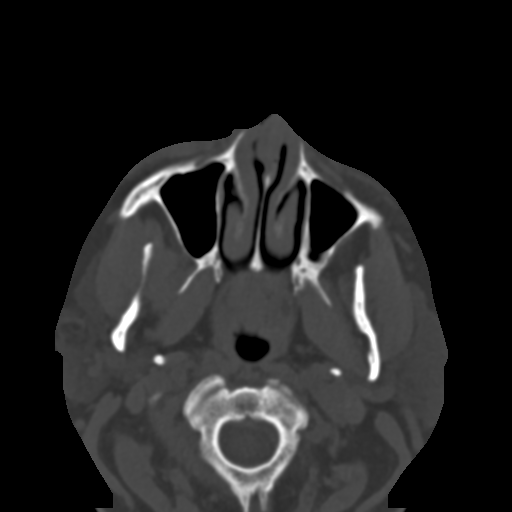
[im 49/77  bone]
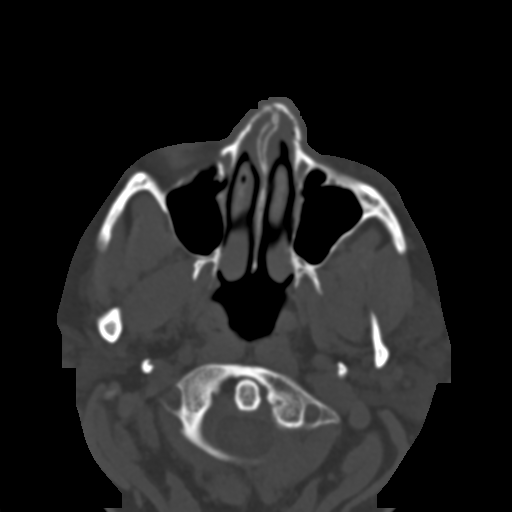
[im 60/77  bone]
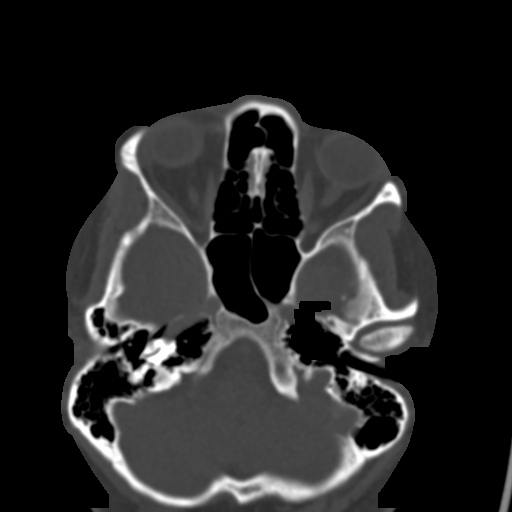
[im 71/77  bone]
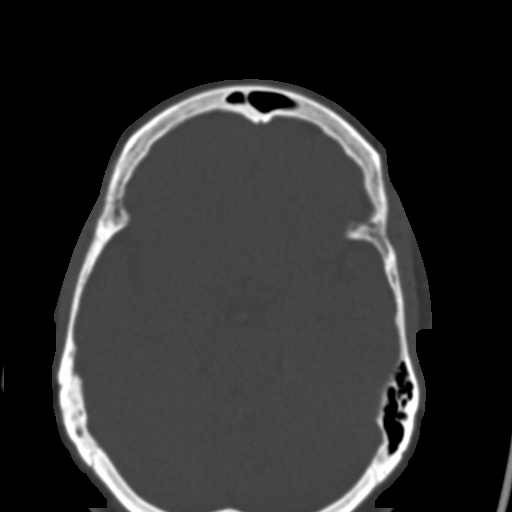

[Series 9: bone cor · coronal · 0.33mm/px · 3 of 131 slices shown]
[im 33/131  bone]
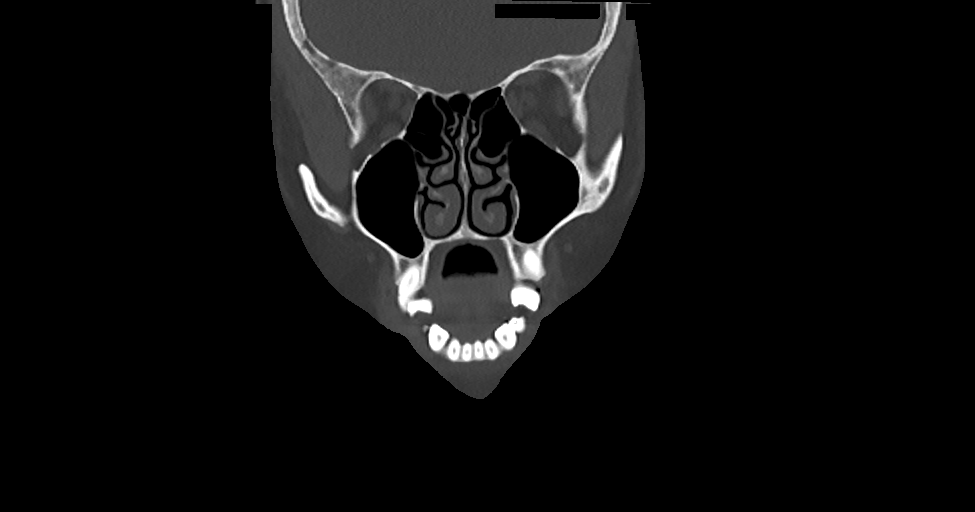
[im 66/131  bone]
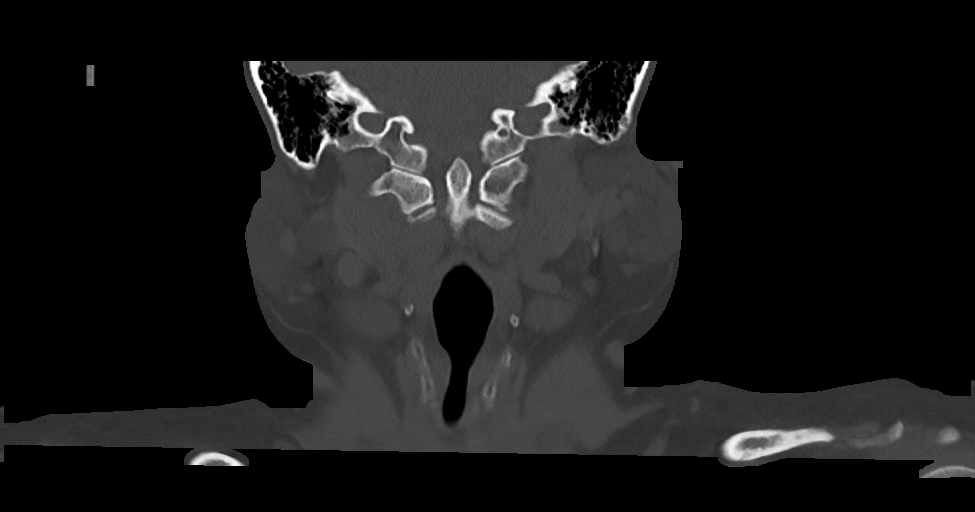
[im 98/131  bone]
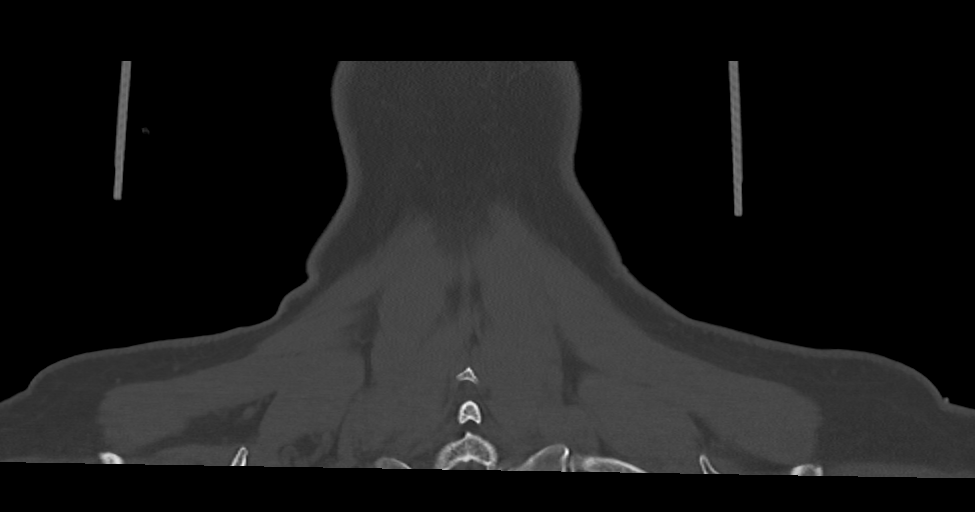

[Series 10: bone sag · sagittal · 0.33mm/px · 2 of 161 slices shown]
[im 54/161  bone]
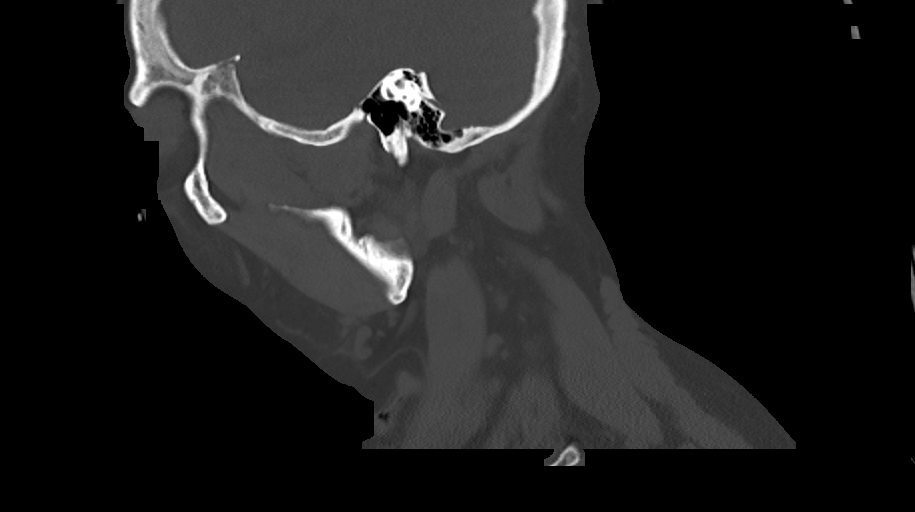
[im 107/161  bone]
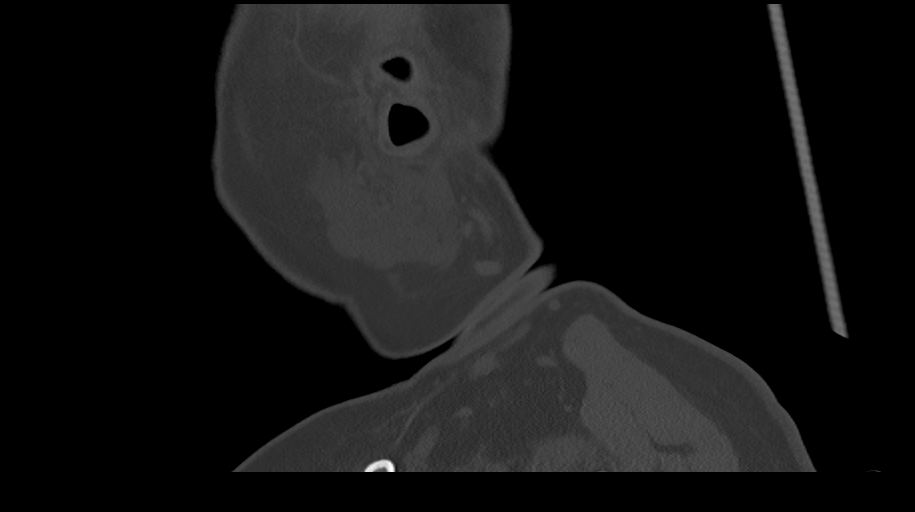

[13 of 47 positions shown; findings below may reference images not displayed]

FINDINGS: Osseous: Acute comminuted and depressed fractures of the anterior
nasal bones with involvement of the anterior nasal septum. Orbital
rims, maxillary antral walls, zygomatic arches, pterygoid plates,
mandibles, and temporomandibular joints appear intact.

Orbits: The globes and extraocular muscles appear intact and
symmetrical.

Sinuses: Paranasal sinuses and mastoid air cells are clear.

Soft tissues: Soft tissue swelling over the bridge of the nose. No
loculated collections identified.

Limited intracranial: No significant or unexpected finding.
IMPRESSION: Acute comminuted and depressed fractures of the anterior nasal bones
with involvement of the anterior nasal septum. Associated soft
tissue swelling.

## 2023-06-05 ENCOUNTER — Encounter (INDEPENDENT_AMBULATORY_CARE_PROVIDER_SITE_OTHER): Payer: Self-pay

## 2023-06-05 ENCOUNTER — Ambulatory Visit (INDEPENDENT_AMBULATORY_CARE_PROVIDER_SITE_OTHER): Payer: Self-pay | Admitting: Otolaryngology

## 2023-06-05 VITALS — Ht 59.0 in | Wt 205.0 lb

## 2023-06-05 DIAGNOSIS — J342 Deviated nasal septum: Secondary | ICD-10-CM

## 2023-06-05 DIAGNOSIS — J3089 Other allergic rhinitis: Secondary | ICD-10-CM

## 2023-06-05 DIAGNOSIS — J3489 Other specified disorders of nose and nasal sinuses: Secondary | ICD-10-CM

## 2023-06-05 DIAGNOSIS — J343 Hypertrophy of nasal turbinates: Secondary | ICD-10-CM

## 2023-06-05 DIAGNOSIS — R0981 Nasal congestion: Secondary | ICD-10-CM

## 2023-06-05 MED ORDER — FLUTICASONE PROPIONATE 50 MCG/ACT NA SUSP: 2.0000 | Freq: Two times a day (BID) | NASAL | 6 refills | Status: AC

## 2023-06-05 MED ORDER — AZELASTINE HCL 0.1 % NA SOLN: 2.0000 | Freq: Two times a day (BID) | NASAL | 12 refills | Status: AC

## 2023-06-05 NOTE — Progress Notes (Signed)
Otolaryngology Clinic Note Referring provider: Self referral HPI:  Traci Snyder is a 57 y.o. female kindly seen as self referral for evaluation of nasal obstruction.   Initial visit (06/05/2023): She reports that about a year ago, she fell and broke her nose and then she has been unable to breathe well since. Both sides. Nothing really helps it but blowing her nose. Especially bothersome at night, and feels like she needs to breathe out of her mouth. She has not tried any sprays. Has not tried [employer] strips.  She does report history of allergies - including to pollen, dirt, and sudden changes in temperature. No facial pressure/pain, hyposmia, or frequent sinus infections, PND or anterior rhinorrhea.  PMHx: Allergic rhinitis, HLD, Pre-diabetes  Interpreter - Roma Kayser  H&N Surgery: Denies Personal or FHx of bleeding dz or anesthesia difficulty: no   AP/AC: no  Tobacco: no. 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 Marene Lenz (09/10/2021): Seen for nasal fracture; had a laceration, had bleeding. CT scan was done. Noted septal deviation (qyer chronic) and noted to have nasal bone fracture. Recommended flonase.  ED notes 09/03/2021 reviewed and available in chart: injured nose, no other injuries.  CT Face 09/03/2021: left nasal septal deviation, nasal bone fracture bilateral, paranasal sinuses clear.  PMH/Meds/All/SocHx/FamHx/ROS:   Past Medical History:  Diagnosis Date   Gestational diabetes    High cholesterol      History reviewed. No pertinent surgical history.  Family History  Problem Relation Age of Onset   Hypertension Mother      Social Connections: Unknown (09/07/2022)   Received from [facility]   Social Network    Social Network: Not on file      Current Outpatient Medications:    azelastine  (ASTELIN) 0.1 % nasal spray, Place 2 sprays into both nostrils 2 (two) times daily. Use in each nostril as directed, Disp: 30 mL, Rfl: 12   fluticasone (FLONASE) 50 MCG/ACT nasal spray, Place 2 sprays into both nostrils in the morning and at bedtime., Disp: 11 mL, Rfl: 6   Cholecalciferol (VITAMIN D3 PO), Take by mouth., Disp: , Rfl:    nystatin-triamcinolone ointment (MYCOLOG), Apply 1 Application topically 2 (two) times daily., Disp: 30 g, Rfl: 1   Physical Exam:   Ht 4\' 11"  (1.499 m)   Wt 205 lb (93 kg)   BMI 41.40 kg/m   Salient findings:  CN II-XII intact  Bilateral EAC clear and TM intact with well pneumatized middle ear spaces Anterior rhinoscopy: Septum deviates left; bilateral inferior turbinates with R > L hypertrophy; given nature of complaint, nasal endoscopy was indicated for further evaluation of nasal cavity and paranasal sinuses and is documented below; modest left nasal bone stepoff; modified 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 A rigid 30 degree endoscope was utilized to evaluate the sinonasal cavities, mucosa, sinus ostia and turbinates and septum.  Overall, signs of mucosal inflammation are not noted.  Also noted are  left septal deviation and bilateral inferior turbinate hypertrophy.  No mucopurulence, polyps, or masses noted.   Right Middle meatus: clear Right SE Recess: clear Left MM: clear Left SE  Recess: clear    Photodocumentation was obtained.  CPT CODE -- 42595 - Mod 25   Impression & Plans:  Traci Snyder is a 57 y.o. female with prior nasal trauma now with: 1. Nasal obstruction   2. Hypertrophy of both inferior nasal turbinates   3. Nasal congestion   4. Nasal septal deviation   5. Non-seasonal allergic rhinitis due to other allergic trigger    She has had nasal obstruction after nasal trauma. She reports bilateral symptoms, but has not tried any sprays/medical management. Endo shows left septal deviation and ITH.  We had a discussion at length regarding her problems. The septal deviation is likely contributing but she also has some allergies which may contribute. She also has not tried sprays and I'd expect her to use sprays after surgery regardless.  We discussed the goals of septoplasty and turbinate reduction, and expectations for postoperative management. We discussed R/B/A including pain, infection, bleeding (~5% risk of operative visit for control), persistent symptoms, need for revision surgery, and other risks including damage to surrounding structures, septal perforation, and injury to skull base with risk of CSF leak and additional intracranial complications, anesthetic complications, among others.  After discussion, decided to proceed with medical management first. Start flonase and astelin. Can trial breathe rite strips.   F/u 6-8 weeks  See below regarding exact medications prescribed this encounter including dosages and route: Meds ordered this encounter  Medications   fluticasone (FLONASE) 50 MCG/ACT nasal spray    Sig: Place 2 sprays into both nostrils in the morning and at bedtime.    Dispense:  11 mL    Refill:  6   azelastine (ASTELIN) 0.1 % nasal spray    Sig: Place 2 sprays into both nostrils 2 (two) times daily. Use in each nostril as directed    Dispense:  30 mL    Refill:  12      Thank you for allowing me the opportunity to care  for your patient. Please do not hesitate to contact me should you have any other questions.  Sincerely, Jovita Kussmaul, MD Otolarynoglogist (ENT), [facility] Phone: [phone] Fax: [phone]  06/06/2023, 11:02 AM   MDM:  Level 4 Complexity/Problems addressed: mod - chronic problem, exacerbation Data complexity: mod - independent review of CT - Morbidity: mod  - Prescription Drug prescribed or managed: yes

## 2023-06-05 NOTE — Patient Instructions (Signed)
Use flonase spray Use astelin spray Use each spray - two sprays each nostril two times per day

## 2023-07-30 ENCOUNTER — Telehealth (INDEPENDENT_AMBULATORY_CARE_PROVIDER_SITE_OTHER): Payer: Self-pay | Admitting: Otolaryngology

## 2023-07-30 NOTE — Telephone Encounter (Signed)
Reminder Call: Date: 07/31/2023 Status: Sch  Time: 10:30 AM [address] [place], [place] [address]  Left voicemail w/time and location.

## 2023-07-31 ENCOUNTER — Ambulatory Visit (INDEPENDENT_AMBULATORY_CARE_PROVIDER_SITE_OTHER): Payer: Self-pay
